# Patient Record
Sex: Female | Born: 1995 | Race: White | Hispanic: No | Marital: Single | Smoking: Former smoker
Health system: Southern US, Community
[De-identification: ages and names within clinical notes are randomized; demographics above are authoritative.]

## PROBLEM LIST (undated history)

## (undated) DIAGNOSIS — J45909 Unspecified asthma, uncomplicated: Secondary | ICD-10-CM

## (undated) HISTORY — PX: ANKLE FRACTURE SURGERY: SHX122

## (undated) HISTORY — PX: CHOLECYSTECTOMY: SHX55

## (undated) HISTORY — PX: NOSE SURGERY: SHX723

---

## 2018-01-30 ENCOUNTER — Encounter (HOSPITAL_BASED_OUTPATIENT_CLINIC_OR_DEPARTMENT_OTHER): Payer: Self-pay | Admitting: Emergency Medicine

## 2018-01-30 ENCOUNTER — Other Ambulatory Visit: Payer: Self-pay

## 2018-01-30 ENCOUNTER — Emergency Department (HOSPITAL_BASED_OUTPATIENT_CLINIC_OR_DEPARTMENT_OTHER)
Admission: EM | Admit: 2018-01-30 | Discharge: 2018-01-30 | Disposition: A | Discharge status: Home / Self Care | Payer: Medicaid Other | Attending: Emergency Medicine | Admitting: Emergency Medicine

## 2018-01-30 DIAGNOSIS — H9203 Otalgia, bilateral: Secondary | ICD-10-CM | POA: Diagnosis present

## 2018-01-30 DIAGNOSIS — J029 Acute pharyngitis, unspecified: Secondary | ICD-10-CM | POA: Diagnosis not present

## 2018-01-30 DIAGNOSIS — F17210 Nicotine dependence, cigarettes, uncomplicated: Secondary | ICD-10-CM | POA: Diagnosis not present

## 2018-01-30 LAB — GROUP A STREP BY PCR: Group A Strep by PCR: NOT DETECTED

## 2018-01-30 NOTE — ED Triage Notes (Signed)
Patient states that she had had ear infection over a week ago  - she reports " it has now gone down to my throat" - patient states that she may have strep throat and her left ear is starting to bother her again. The patient reports that " I know that I have had fevers at night cause a am tossing and turning and sweating all night long" - The patient reports that she has not taken her temp and reports that she is taking aleve for the fever

## 2018-01-30 NOTE — ED Provider Notes (Signed)
[facility] EMERGENCY DEPARTMENT Provider Note   CSN: [id number] Arrival date & time: 01/30/18  1610     History   Chief Complaint Chief Complaint  Patient presents with  . Sore Throat    HPI Tiffany Anthony is a 22 y.o. female presents for evaluation of sore throat x3 days and bilateral ear pain, right greater than left x5 days.  Patient reports that initially she started having ear pain in the right ear.  She states that she used antibiotic eardrops from her friend which provided some improvement.  She reports that 3 days ago, she started having some sore throat.  She states that then she started having pain in bilateral ears.  Patient states she is been taking naproxen, allergy medication, Chloraseptic spray to help with sore throat.  She reports some temporary improvement.  Patient states that she is still able to tolerate her secretions, p.o. without any difficulty.  She states that she has had some subjective fevers but states she has not measured a temperature.  Patient denies any difficulty breathing, chest pain, cough.  The history is provided by the patient.    History reviewed. No pertinent past medical history.  There are no active problems to display for this patient.   Past Surgical History:  Procedure Laterality Date  . NOSE SURGERY       OB History   None      Home Medications    Prior to Admission medications   Not on File    Family History History reviewed. No pertinent family history.  Social History Social History   Tobacco Use  . Smoking status: Current Every Day Smoker  . Smokeless tobacco: Never Used  Substance Use Topics  . Alcohol use: Never    Frequency: Never  . Drug use: Never     Allergies   Patient has no known allergies.   Review of Systems Review of Systems  Constitutional: Negative for fever.  HENT: Positive for ear pain and sore throat. Negative for drooling and trouble swallowing.   Respiratory: Negative for  cough and shortness of breath.   Cardiovascular: Negative for chest pain.  All other systems reviewed and are negative.    Physical Exam Updated Vital Signs BP 136/78 (BP Location: Left Arm)   Pulse 98   Temp 98.3 F (36.8 C) (Oral)   Resp 19   Ht 5\' 6"  (1.676 m)   Wt 93.1 kg (205 lb 4.8 oz)   LMP 01/30/2018   SpO2 100%   BMI 33.14 kg/m   Physical Exam  Constitutional: She appears well-developed and well-nourished.  HENT:  Head: Normocephalic and atraumatic.  Right Ear: Tympanic membrane normal.  Left Ear: Tympanic membrane normal.  Mouth/Throat: Uvula is midline. No trismus in the jaw. Posterior oropharyngeal erythema present.  Right TM is without any effusion, bulging, erythematous.  She does have some slight erythema to the external auditory canal.  No pain with movement of tragus.  Left TM is without any abnormality.  Posterior oropharynx is erythematous.  No exudates, edema.  Uvula is midline.  No trismus.  Airways patent, phonation is intact.  Eyes: Conjunctivae and EOM are normal. Right eye exhibits no discharge. Left eye exhibits no discharge. No scleral icterus.  Pulmonary/Chest: Effort normal.  Neurological: She is alert.  Skin: Skin is warm and dry.  Psychiatric: She has a normal mood and affect. Her speech is normal and behavior is normal.  Nursing note and vitals reviewed.    ED  Treatments / Results  Labs (all labs ordered are listed, but only abnormal results are displayed) Labs Reviewed  GROUP A STREP BY PCR    EKG None  Radiology No results found.  Procedures Procedures (including critical care time)  Medications Ordered in ED Medications - No data to display   Initial Impression / Assessment and Plan / ED Course  I have reviewed the triage vital signs and the nursing notes.  Pertinent labs & imaging results that were available during my care of the patient were reviewed by me and considered in my medical decision making (see chart for  details).     22 year old female who presents for evaluation of sore throat and ear pain.  Reports subjective fevers.  Reports that the ear pain is bilateral but right greater than left.  She has been using her friend's antibiotic eardrops with no improvement. Patient is afebrile, non-toxic appearing, sitting comfortably on examination table. Vital signs reviewed and stable.  On exam, patient has posterior oropharynx erythema.  She does have some erythema noted to the right external auditory canal but no TM erythema, bulging, signs of effusion.  Consider pharyngitis.  History/physical exam is not concerning for acute otitis media, acute otitis externa, mastoiditis./Physical exam is not concerning for Ludwig angina or peritonsillar abscess.  Rapid strep ordered at triage.  Rapid strep reviewed.  Negative.  Discussed results with patient.  Encourage at home supportive care measures. Patient had ample opportunity for questions and discussion. All patient's questions were answered with full understanding. Strict return precautions discussed. Patient expresses understanding and agreement to plan.   Final Clinical Impressions(s) / ED Diagnoses   Final diagnoses:  Sore throat  Otalgia of both ears    ED Discharge Orders    None       Maxwell Caul, PA-C 01/30/18 1750    Charlynne Pander, MD 01/31/18 1504

## 2018-01-30 NOTE — Discharge Instructions (Addendum)
You can take Tylenol or Ibuprofen as directed for pain. You can alternate Tylenol and Ibuprofen every 4 hours. If you take Tylenol at 1pm, then you can take Ibuprofen at 5pm. Then you can take Tylenol again at 9pm.   Make sure you are drinking plenty of fluids and staying hydrated.   Return to the Emergency Department for any worsening pain, fever, difficulty swallowing your saliva, o

## 2018-05-20 ENCOUNTER — Encounter (HOSPITAL_BASED_OUTPATIENT_CLINIC_OR_DEPARTMENT_OTHER): Payer: Self-pay

## 2018-05-20 ENCOUNTER — Emergency Department (HOSPITAL_BASED_OUTPATIENT_CLINIC_OR_DEPARTMENT_OTHER)
Admission: EM | Admit: 2018-05-20 | Discharge: 2018-05-20 | Disposition: A | Discharge status: Home / Self Care | Payer: Medicaid Other | Attending: Emergency Medicine | Admitting: Emergency Medicine

## 2018-05-20 ENCOUNTER — Other Ambulatory Visit: Payer: Self-pay

## 2018-05-20 DIAGNOSIS — G501 Atypical facial pain: Secondary | ICD-10-CM | POA: Insufficient documentation

## 2018-05-20 DIAGNOSIS — F17200 Nicotine dependence, unspecified, uncomplicated: Secondary | ICD-10-CM | POA: Insufficient documentation

## 2018-05-20 DIAGNOSIS — R519 Headache, unspecified: Secondary | ICD-10-CM

## 2018-05-20 DIAGNOSIS — R51 Headache: Secondary | ICD-10-CM

## 2018-05-20 NOTE — ED Notes (Addendum)
Pt seated in ED WR-states she "stepped outside"-pt was called x 3 and was not in ED WR-pt was taken out as LWBS-pt notified of the above and that she would be placed back in computer to be seen-NAD-steady gait

## 2018-05-20 NOTE — ED Provider Notes (Signed)
[facility] EMERGENCY DEPARTMENT Provider Note   CSN: [id number] Arrival date & time: 05/20/18  1248     History   Chief Complaint Chief Complaint  Patient presents with  . Sore    HPI Tiffany Anthony is a 22 y.o. female.m without significant past medical history, presenting to the emergency department with complaint of erythema and pain to the left side of her face that began Wednesday morning.  She states she woke up and noticed a small red swelling to her left cheek that was slightly painful.  She states she thinks she may have been bitten by spider overnight because she cleaned up two spiders from her bedroom earlier in the week.  Denies any tick bites.  Denies difficulty breathing or swelling, fevers, drainage.  States she woke up at night with pain to the left face, redness was increased, and she felt some pain in her lymph nodes below her left ear.  Denies swelling of lips or tongue, denies itching.  Has been taking ibuprofen which significantly improved swelling in symptoms.  The history is provided by the patient.    History reviewed. No pertinent past medical history.  There are no active problems to display for this patient.   Past Surgical History:  Procedure Laterality Date  . NOSE SURGERY       OB History   None      Home Medications    Prior to Admission medications   Not on File    Family History No family history on file.  Social History Social History   Tobacco Use  . Smoking status: Current Every Day Smoker  . Smokeless tobacco: Never Used  Substance Use Topics  . Alcohol use: Not Currently    Frequency: Never  . Drug use: Never     Allergies   Latex   Review of Systems Review of Systems  Constitutional: Negative for fever.  Skin: Positive for color change.     Physical Exam Updated Vital Signs BP 126/78 (BP Location: Right Arm)   Pulse 85   Temp 98.6 F (37 C) (Oral)   Resp 18   Ht 5\' 6"  (1.676 m)   Wt 96.9 kg    LMP 05/11/2018   SpO2 100%   BMI 34.48 kg/m   Physical Exam  Constitutional: She appears well-developed and well-nourished. No distress.  HENT:  Head: Normocephalic and atraumatic.  No involvement of oral mucosa.  No swelling to lips or tongue.  Eyes: Conjunctivae are normal.  Cardiovascular: Normal rate.  Pulmonary/Chest: Effort normal.  Skin:  Very small erythematous papule to the left cheek.  There is no surrounding erythema or induration.  There is no fluctuance.  There is no pustule or vesicle.  There is no petechia.  No lymphadenopathy palpated.  Psychiatric: She has a normal mood and affect. Her behavior is normal.  Nursing note and vitals reviewed.    ED Treatments / Results  Labs (all labs ordered are listed, but only abnormal results are displayed) Labs Reviewed - No data to display  EKG None  Radiology No results found.  Procedures Procedures (including critical care time)  Medications Ordered in ED Medications - No data to display   Initial Impression / Assessment and Plan / ED Course  I have reviewed the triage vital signs and the nursing notes.  Pertinent labs & imaging results that were available during my care of the patient were reviewed by me and considered in my medical decision making (see chart  for details).     Pt with small area of redness to the left cheek, thinks she may have been bitten by spider overnight though did not physically see a bite occur.  No symptoms of anaphylaxis.  No drainage.  No fever.  Treated with ibuprofen with improvement.  On exam there is a small erythematous papule, without pustular vesicle. May become an open comedome, cannot exclude insect bite. No fluctuance to suggest abscess.  There is no surrounding cellulitis or induration.  Do not think antibiotics are indicated at this time.  Discussed symptomatic management including warm compresses and continue ibuprofen.  Follow-up with primary care provider if symptoms  persist. Safe for discharge.   Discussed results, findings, treatment and follow up. Patient advised of return precautions. Patient verbalized understanding and agreed with plan.  Final Clinical Impressions(s) / ED Diagnoses   Final diagnoses:  Left-sided face pain    ED Discharge Orders    None       Monick Rena, [place] N, PA-C 05/20/18 1615    Rolan BuccoBelfi, Melanie, MD 05/20/18 2321

## 2018-05-20 NOTE — Discharge Instructions (Signed)
Apply warm compresses to your cheek, multiple times per day. Continue taking ibuprofen every 6 hours as needed for pain. Follow up with your primary care provider if symptoms persist.

## 2018-05-20 NOTE — ED Triage Notes (Signed)
Pt c/o a sore to left side of face yesterday-feels it may be a spider bite-small red area-NAD-steady gait

## 2018-10-09 ENCOUNTER — Other Ambulatory Visit: Payer: Self-pay

## 2018-10-09 ENCOUNTER — Emergency Department (HOSPITAL_BASED_OUTPATIENT_CLINIC_OR_DEPARTMENT_OTHER)
Admission: EM | Admit: 2018-10-09 | Discharge: 2018-10-09 | Disposition: A | Discharge status: Home / Self Care | Payer: Medicaid Other | Attending: Emergency Medicine | Admitting: Emergency Medicine

## 2018-10-09 ENCOUNTER — Encounter (HOSPITAL_BASED_OUTPATIENT_CLINIC_OR_DEPARTMENT_OTHER): Payer: Self-pay | Admitting: Emergency Medicine

## 2018-10-09 DIAGNOSIS — Z79899 Other long term (current) drug therapy: Secondary | ICD-10-CM | POA: Diagnosis not present

## 2018-10-09 DIAGNOSIS — J3489 Other specified disorders of nose and nasal sinuses: Secondary | ICD-10-CM | POA: Diagnosis not present

## 2018-10-09 DIAGNOSIS — Z91040 Latex allergy status: Secondary | ICD-10-CM | POA: Diagnosis not present

## 2018-10-09 DIAGNOSIS — J45909 Unspecified asthma, uncomplicated: Secondary | ICD-10-CM | POA: Diagnosis not present

## 2018-10-09 DIAGNOSIS — Z760 Encounter for issue of repeat prescription: Secondary | ICD-10-CM | POA: Insufficient documentation

## 2018-10-09 DIAGNOSIS — F17200 Nicotine dependence, unspecified, uncomplicated: Secondary | ICD-10-CM | POA: Diagnosis not present

## 2018-10-09 DIAGNOSIS — R067 Sneezing: Secondary | ICD-10-CM | POA: Diagnosis not present

## 2018-10-09 DIAGNOSIS — H5789 Other specified disorders of eye and adnexa: Secondary | ICD-10-CM | POA: Diagnosis not present

## 2018-10-09 DIAGNOSIS — R0602 Shortness of breath: Secondary | ICD-10-CM | POA: Diagnosis present

## 2018-10-09 MED ORDER — ALBUTEROL SULFATE HFA 108 (90 BASE) MCG/ACT IN AERS: 1.0000 | INHALATION_SPRAY | Freq: Four times a day (QID) | RESPIRATORY_TRACT | 0 refills | Status: AC | PRN

## 2018-10-09 MED ORDER — CETIRIZINE HCL 10 MG PO TABS: 10.0000 mg | ORAL_TABLET | Freq: Every day | ORAL | 0 refills | Status: AC

## 2018-10-09 NOTE — ED Triage Notes (Signed)
Patient states that she has run out of her inhaler about 2 -3 days ago and now she is starting to have some SOB and pain in her lungs when she is walking her dog outside

## 2018-10-09 NOTE — ED Provider Notes (Signed)
[facility] EMERGENCY DEPARTMENT Provider Note   CSN: [id number] Arrival date & time: 10/09/18  1723    History   Chief Complaint Chief Complaint  Patient presents with  . Medication Refill    HPI Tiffany Anthony is a 23 y.o. female with past medical history of asthma, seasonal allergies, presenting to the emergency department with request for albuterol inhaler refill.  Patient states she ran out a few days ago.  She states her seasonal allergies have been acting up, however she is not nearly out of her Zyrtec as well.  She feels short of breath with chest tightness when walking her dog.  No productive cough, fever, or symptoms of illness.  She does endorse sneezing, rhinorrhea and itchy eyes, typical for her seasonal allergies.  She is not currently short of breath in the ED.  She states she takes a daily inhaler, which she thinks may be Atrovent, as well as rescue inhaler and Zyrtec.     The history is provided by the patient.    History reviewed. No pertinent past medical history.  There are no active problems to display for this patient.   Past Surgical History:  Procedure Laterality Date  . NOSE SURGERY       OB History   No obstetric history on file.      Home Medications    Prior to Admission medications   Medication Sig Start Date End Date Taking? Authorizing Provider  etonogestrel (NEXPLANON) 68 MG IMPL implant Nexplanon 68 mg subdermal implant  Inject by subcutaneous route. 12/22/14  Yes [provider]  albuterol (PROVENTIL HFA;VENTOLIN HFA) 108 (90 Base) MCG/ACT inhaler Inhale 1-2 puffs into the lungs every 6 (six) hours as needed for wheezing or shortness of breath. 10/09/18   Jady Braggs, [place] N, PA-C  cetirizine (ZYRTEC) 10 MG tablet Take 1 tablet (10 mg total) by mouth daily. 10/09/18   Hayward Rylander, [place] N, PA-C    Family History History reviewed. No pertinent family history.  Social History Social History   Tobacco Use  . Smoking  status: Current Every Day Smoker  . Smokeless tobacco: Never Used  Substance Use Topics  . Alcohol use: Not Currently    Frequency: Never  . Drug use: Never     Allergies   Latex   Review of Systems Review of Systems  Constitutional: Negative for fever.  HENT: Positive for rhinorrhea and sneezing. Negative for congestion.   Eyes: Positive for itching.  Respiratory: Positive for shortness of breath (not active).      Physical Exam Updated Vital Signs BP 111/60 (BP Location: Left Arm)   Pulse 85   Temp 98.5 F (36.9 C) (Oral)   Resp 20   Ht 5\' 6"  (1.676 m)   Wt 90.7 kg   LMP 09/12/2018   SpO2 100%   BMI 32.28 kg/m   Physical Exam Vitals signs and nursing note reviewed.  Constitutional:      General: She is not in acute distress.    Appearance: She is well-developed.  HENT:     Head: Normocephalic and atraumatic.     Mouth/Throat:     Mouth: Mucous membranes are moist.     Pharynx: Oropharynx is clear.  Eyes:     Conjunctiva/sclera: Conjunctivae normal.  Neck:     Musculoskeletal: Normal range of motion and neck supple.  Cardiovascular:     Rate and Rhythm: Normal rate and regular rhythm.  Pulmonary:     Effort: Pulmonary effort is normal. No  respiratory distress.     Breath sounds: Normal breath sounds.  Lymphadenopathy:     Cervical: No cervical adenopathy.  Neurological:     Mental Status: She is alert.  Psychiatric:        Mood and Affect: Mood normal.        Behavior: Behavior normal.      ED Treatments / Results  Labs (all labs ordered are listed, but only abnormal results are displayed) Labs Reviewed - No data to display  EKG None  Radiology No results found.  Procedures Procedures (including critical care time)  Medications Ordered in ED Medications - No data to display   Initial Impression / Assessment and Plan / ED Course  I have reviewed the triage vital signs and the nursing notes.  Pertinent labs & imaging results that  were available during my care of the patient were reviewed by me and considered in my medical decision making (see chart for details).        Patient presenting requesting refill for albuterol inhaler and Zyrtec.  She endorses symptoms of seasonal allergies as well.  She is not currently short of breath in the ED. States she ran out of her medications a couple of days ago.  She states she went to urgent care prior to this, however they sent her here because she was unable to afford urgent care visit.  Lung exam is clear.  Vital signs are normal.  O2 saturation 100% on room air.  Not consistent with asthma exacerbation.  Will prescribe refill for albuterol inhaler and Zyrtec.  Patient states she takes a third inhaler daily, not as needed, and thinks this may be Atrovent.  Discussed with patient that Atrovent is not prescribed this way and recommend she follow-up with her primary care for refill of her daily inhaler, as it is likely a different medication.  Well-appearing and safe for discharge.  Discussed results, findings, treatment and follow up. Patient advised of return precautions. Patient verbalized understanding and agreed with plan.   Final Clinical Impressions(s) / ED Diagnoses   Final diagnoses:  Encounter for medication refill  Mild asthma without complication, unspecified whether persistent    ED Discharge Orders         Ordered    albuterol (PROVENTIL HFA;VENTOLIN HFA) 108 (90 Base) MCG/ACT inhaler  Every 6 hours PRN     10/09/18 1814    cetirizine (ZYRTEC) 10 MG tablet  Daily     10/09/18 1814           Almee Pelphrey, [place] N, PA-C 10/09/18 Carolynn [id number]    Campos, Kevin, MD 10/09/18 2038

## 2021-05-08 ENCOUNTER — Emergency Department (HOSPITAL_BASED_OUTPATIENT_CLINIC_OR_DEPARTMENT_OTHER)
Admission: EM | Admit: 2021-05-08 | Discharge: 2021-05-08 | Disposition: A | Discharge status: Home / Self Care | Payer: Medicaid Other | Attending: Emergency Medicine | Admitting: Emergency Medicine

## 2021-05-08 ENCOUNTER — Encounter (HOSPITAL_BASED_OUTPATIENT_CLINIC_OR_DEPARTMENT_OTHER): Payer: Self-pay | Admitting: Emergency Medicine

## 2021-05-08 ENCOUNTER — Other Ambulatory Visit: Payer: Self-pay

## 2021-05-08 DIAGNOSIS — Z20822 Contact with and (suspected) exposure to covid-19: Secondary | ICD-10-CM | POA: Diagnosis not present

## 2021-05-08 DIAGNOSIS — J101 Influenza due to other identified influenza virus with other respiratory manifestations: Secondary | ICD-10-CM | POA: Insufficient documentation

## 2021-05-08 DIAGNOSIS — G43009 Migraine without aura, not intractable, without status migrainosus: Secondary | ICD-10-CM

## 2021-05-08 DIAGNOSIS — J45909 Unspecified asthma, uncomplicated: Secondary | ICD-10-CM | POA: Diagnosis not present

## 2021-05-08 DIAGNOSIS — G43909 Migraine, unspecified, not intractable, without status migrainosus: Secondary | ICD-10-CM | POA: Diagnosis not present

## 2021-05-08 DIAGNOSIS — Z91040 Latex allergy status: Secondary | ICD-10-CM | POA: Insufficient documentation

## 2021-05-08 DIAGNOSIS — Z87891 Personal history of nicotine dependence: Secondary | ICD-10-CM | POA: Diagnosis not present

## 2021-05-08 DIAGNOSIS — R519 Headache, unspecified: Secondary | ICD-10-CM | POA: Diagnosis present

## 2021-05-08 HISTORY — DX: Unspecified asthma, uncomplicated: J45.909

## 2021-05-08 LAB — RESP PANEL BY RT-PCR (FLU A&B, COVID) ARPGX2
Influenza A by PCR: POSITIVE — AB
Influenza B by PCR: NEGATIVE
SARS Coronavirus 2 by RT PCR: NEGATIVE

## 2021-05-08 LAB — GROUP A STREP BY PCR: Group A Strep by PCR: NOT DETECTED

## 2021-05-08 MED ORDER — DEXAMETHASONE SODIUM PHOSPHATE 10 MG/ML IJ SOLN
10.0000 mg | Freq: Once | INTRAMUSCULAR | Status: AC
  Administered 2021-05-08: 10 mg via INTRAVENOUS
  Filled 2021-05-08: qty 1

## 2021-05-08 MED ORDER — LACTATED RINGERS IV BOLUS
1000.0000 mL | Freq: Once | INTRAVENOUS | Status: AC
  Administered 2021-05-08: 1000 mL via INTRAVENOUS

## 2021-05-08 MED ORDER — PROCHLORPERAZINE EDISYLATE 10 MG/2ML IJ SOLN
10.0000 mg | Freq: Once | INTRAMUSCULAR | Status: AC
  Administered 2021-05-08: 10 mg via INTRAVENOUS
  Filled 2021-05-08: qty 2

## 2021-05-08 MED ORDER — ACETAMINOPHEN 325 MG PO TABS
650.0000 mg | ORAL_TABLET | Freq: Once | ORAL | Status: AC
  Administered 2021-05-08: 650 mg via ORAL
  Filled 2021-05-08: qty 2

## 2021-05-08 MED ORDER — KETOROLAC TROMETHAMINE 30 MG/ML IJ SOLN
30.0000 mg | Freq: Once | INTRAMUSCULAR | Status: AC
Start: 2021-05-08 — End: 2021-05-08
  Administered 2021-05-08: 30 mg via INTRAVENOUS
  Filled 2021-05-08: qty 1

## 2021-05-08 NOTE — ED Triage Notes (Signed)
Pt is c/o headache x 2 days  Pt also c/o fever, chills, states her eyes hurt, and nausea

## 2021-05-08 NOTE — ED Provider Notes (Signed)
[facility] EMERGENCY DEPARTMENT Provider Note   CSN: [id number] Arrival date & time: 05/08/21  0244     History Chief Complaint  Patient presents with   Headache    Tiffany Anthony is a 25 y.o. female.  The history is provided by the patient.  Headache She has history of asthma, migraines and comes in complaining of a migraine headache which started yesterday morning.  Headache is bifrontal and also involves the face, and is typical of her migraines.  She describes a throbbing pain which she rates at 9/10.  There is associated photophobia and phonophobia.  She complains of nausea and vomiting.  There has also been a sore throat and she has noted fever and chills.  She has tried taking Excedrin migraine and ibuprofen without any relief.  She denies any cough and denies arthralgias or myalgias.  She did take a home COVID test this morning which was negative.   Past Medical History:  Diagnosis Date   Asthma     There are no problems to display for this patient.   Past Surgical History:  Procedure Laterality Date   NOSE SURGERY       OB History   No obstetric history on file.     Family History  Problem Relation Age of Onset   Migraines Mother    Hypertension Mother    Diabetes Mother     Social History   Tobacco Use   Smoking status: Former    Types: Cigarettes   Smokeless tobacco: Never  Vaping Use   Vaping Use: Some days   Substances: Nicotine  Substance Use Topics   Alcohol use: Never   Drug use: Never    Home Medications Prior to Admission medications   Medication Sig Start Date End Date Taking? Authorizing Provider  albuterol (PROVENTIL HFA;VENTOLIN HFA) 108 (90 Base) MCG/ACT inhaler Inhale 1-2 puffs into the lungs every 6 (six) hours as needed for wheezing or shortness of breath. 10/09/18   Robinson, [place] N, PA-C  cetirizine (ZYRTEC) 10 MG tablet Take 1 tablet (10 mg total) by mouth daily. 10/09/18   Robinson, [place] N, PA-C  etonogestrel  (NEXPLANON) 68 MG IMPL implant Nexplanon 68 mg subdermal implant  Inject by subcutaneous route. 12/22/14   [provider]    Allergies    Latex  Review of Systems   Review of Systems  Neurological:  Positive for headaches.  All other systems reviewed and are negative.  Physical Exam Updated Vital Signs BP 120/61 (BP Location: Right Arm)   Pulse (!) 134   Temp (!) 100.8 F (38.2 C) (Oral)   Resp 20   Ht 5\' 6"  (1.676 m)   Wt 107.7 kg   SpO2 98%   BMI 38.33 kg/m   Physical Exam Vitals and nursing note reviewed.  25 year old female, resting comfortably and in no acute distress. Vital signs are significant for elevated heart rate and temperature. Oxygen saturation is 98%, which is normal. Head is normocephalic and atraumatic. PERRLA, EOMI. Oropharynx is clear.  There is tenderness over the temporalis muscles bilaterally as well as over the frontal and maxillary sinuses. Neck is nontender and supple without adenopathy or JVD. Back is nontender and there is no CVA tenderness. Lungs are clear without rales, wheezes, or rhonchi. Chest is nontender. Heart has regular rate and rhythm without murmur. Abdomen is soft, flat, nontender without masses or hepatosplenomegaly and peristalsis is normoactive. Extremities have no cyanosis or edema, full range of motion is  present. Skin is warm and dry without rash. Neurologic: Mental status is normal, cranial nerves are intact, moves all extremities equally.  ED Results / Procedures / Treatments   Labs (all labs ordered are listed, but only abnormal results are displayed) Labs Reviewed  RESP PANEL BY RT-PCR (FLU A&B, COVID) [id number]  GROUP A STREP BY PCR    EKG None  Radiology No results found.  Procedures Procedures   Medications Ordered in ED Medications  lactated ringers bolus 1,000 mL (0 mLs Intravenous Stopped 05/08/21 0458)  prochlorperazine (COMPAZINE) injection 10 mg (10 mg Intravenous Given 05/08/21 0325)   dexamethasone (DECADRON) injection 10 mg (10 mg Intravenous Given 05/08/21 0328)  ketorolac (TORADOL) 30 MG/ML injection 30 mg (30 mg Intravenous Given 05/08/21 0323)  acetaminophen (TYLENOL) tablet 650 mg (650 mg Oral Given 05/08/21 0330)  lactated ringers bolus 1,000 mL (1,000 mLs Intravenous New Bag/Given 05/08/21 0458)    ED Course  I have reviewed the triage vital signs and the nursing notes.  Pertinent labs & imaging results that were available during my care of the patient were reviewed by me and considered in my medical decision making (see chart for details).    MDM Rules/Calculators/A&P                         Fever and headache.  Presentation is suggestive of viral illness such as influenza, also consider streptococcal infection.  She will be given IV fluids, prochlorperazine, dexamethasone, ketorolac.  Will check respiratory pathogen panel and strep PCR.  Old records are reviewed, and she has no relevant past visits.  Swabs are positive for influenza A.  Headache is markedly improved following above-noted treatment, but blood pressure is slightly low.  She will be given additional IV fluids.  Blood pressure has come up with the second liter of IV fluids, and she is discharged.  Final Clinical Impression(s) / ED Diagnoses Final diagnoses:  Migraine without aura and without status migrainosus, not intractable  Influenza A    Rx / DC Orders ED Discharge Orders     None        Dione Booze, MD 05/08/21 [phone]

## 2021-08-16 ENCOUNTER — Other Ambulatory Visit: Payer: Self-pay

## 2021-08-16 ENCOUNTER — Encounter (HOSPITAL_BASED_OUTPATIENT_CLINIC_OR_DEPARTMENT_OTHER): Payer: Self-pay | Admitting: Emergency Medicine

## 2021-08-16 ENCOUNTER — Emergency Department (HOSPITAL_BASED_OUTPATIENT_CLINIC_OR_DEPARTMENT_OTHER): Payer: Medicaid Other

## 2021-08-16 ENCOUNTER — Emergency Department (HOSPITAL_BASED_OUTPATIENT_CLINIC_OR_DEPARTMENT_OTHER)
Admission: EM | Admit: 2021-08-16 | Discharge: 2021-08-16 | Disposition: A | Discharge status: Home / Self Care | Payer: Medicaid Other | Attending: Emergency Medicine | Admitting: Emergency Medicine

## 2021-08-16 DIAGNOSIS — R1032 Left lower quadrant pain: Secondary | ICD-10-CM | POA: Diagnosis present

## 2021-08-16 DIAGNOSIS — Z91040 Latex allergy status: Secondary | ICD-10-CM | POA: Diagnosis not present

## 2021-08-16 DIAGNOSIS — N390 Urinary tract infection, site not specified: Secondary | ICD-10-CM | POA: Insufficient documentation

## 2021-08-16 LAB — PREGNANCY, URINE: Preg Test, Ur: NEGATIVE

## 2021-08-16 LAB — URINALYSIS, ROUTINE W REFLEX MICROSCOPIC
Bilirubin Urine: NEGATIVE
Glucose, UA: NEGATIVE mg/dL
Ketones, ur: NEGATIVE mg/dL
Leukocytes,Ua: NEGATIVE
Nitrite: NEGATIVE
Protein, ur: NEGATIVE mg/dL
Specific Gravity, Urine: 1.025 (ref 1.005–1.030)
pH: 6.5 (ref 5.0–8.0)

## 2021-08-16 LAB — COMPREHENSIVE METABOLIC PANEL
ALT: 18 U/L (ref 0–44)
AST: 20 U/L (ref 15–41)
Albumin: 4.4 g/dL (ref 3.5–5.0)
Alkaline Phosphatase: 65 U/L (ref 38–126)
Anion gap: 9 (ref 5–15)
BUN: 13 mg/dL (ref 6–20)
CO2: 25 mmol/L (ref 22–32)
Calcium: 9.4 mg/dL (ref 8.9–10.3)
Chloride: 102 mmol/L (ref 98–111)
Creatinine, Ser: 0.68 mg/dL (ref 0.44–1.00)
GFR, Estimated: >60 mL/min (ref >60)
Glucose, Bld: 98 mg/dL (ref 70–99)
Potassium: 3.9 mmol/L (ref 3.5–5.1)
Sodium: 136 mmol/L (ref 135–145)
Total Bilirubin: 0.6 mg/dL (ref 0.3–1.2)
Total Protein: 8.2 g/dL — ABNORMAL HIGH (ref 6.5–8.1)

## 2021-08-16 LAB — CBC WITH DIFFERENTIAL/PLATELET
Abs Immature Granulocytes: 0.04 10*3/uL (ref 0.00–0.07)
Basophils Absolute: 0.1 10*3/uL (ref 0.0–0.1)
Basophils Relative: 1 %
Eosinophils Absolute: 0.3 10*3/uL (ref 0.0–0.5)
Eosinophils Relative: 3 %
HCT: 39.6 % (ref 36.0–46.0)
Hemoglobin: 13.7 g/dL (ref 12.0–15.0)
Immature Granulocytes: 0 %
Lymphocytes Relative: 27 %
Lymphs Abs: 2.6 10*3/uL (ref 0.7–4.0)
MCH: 29.8 pg (ref 26.0–34.0)
MCHC: 34.6 g/dL (ref 30.0–36.0)
MCV: 86.3 fL (ref 80.0–100.0)
Monocytes Absolute: 0.6 10*3/uL (ref 0.1–1.0)
Monocytes Relative: 7 %
Neutro Abs: 6 10*3/uL (ref 1.7–7.7)
Neutrophils Relative %: 62 %
Platelets: 338 10*3/uL (ref 150–400)
RBC: 4.59 MIL/uL (ref 3.87–5.11)
RDW: 12.2 % (ref 11.5–15.5)
WBC: 9.7 10*3/uL (ref 4.0–10.5)
nRBC: 0 % (ref 0.0–0.2)

## 2021-08-16 LAB — URINALYSIS, MICROSCOPIC (REFLEX)

## 2021-08-16 MED ORDER — NITROFURANTOIN MONOHYD MACRO 100 MG PO CAPS
100.0000 mg | ORAL_CAPSULE | Freq: Once | ORAL | Status: AC
  Administered 2021-08-16: 100 mg via ORAL
  Filled 2021-08-16: qty 1

## 2021-08-16 MED ORDER — KETOROLAC TROMETHAMINE 30 MG/ML IJ SOLN
30.0000 mg | Freq: Once | INTRAMUSCULAR | Status: AC
  Administered 2021-08-16: 30 mg via INTRAVENOUS
  Filled 2021-08-16: qty 1

## 2021-08-16 MED ORDER — NITROFURANTOIN MONOHYD MACRO 100 MG PO CAPS: 100.0000 mg | ORAL_CAPSULE | Freq: Two times a day (BID) | ORAL | 0 refills | Status: DC

## 2021-08-16 NOTE — ED Triage Notes (Signed)
Pt c/o LLQ abd pain

## 2021-08-16 NOTE — ED Provider Notes (Addendum)
[facility] EMERGENCY DEPARTMENT Provider Note   CSN: [id number] Arrival date & time: 08/16/21  0400     History  Chief Complaint  Patient presents with   Abdominal Pain    Tiffany Anthony is a 26 y.o. female.  The history is provided by the patient.  Abdominal Pain Pain location:  LUQ and LLQ Pain quality: bloating   Pain radiates to:  Does not radiate Pain severity:  Moderate Onset quality:  Sudden Duration: hours. Timing:  Constant Progression:  Unchanged Chronicity:  New Context: not diet changes, not eating, not suspicious food intake and not trauma   Relieved by:  Nothing Worsened by:  Nothing Ineffective treatments:  None tried Associated symptoms: no anorexia, no constipation, no diarrhea, no fever, no hematemesis, no nausea and no vomiting   Risk factors: no alcohol abuse   Patient with no significant PMH who presents with LUQ/LLQ pain that feels bloated and swollen.  No f/c/r.  No n/v/d.      Home Medications Prior to Admission medications   Medication Sig Start Date End Date Taking? Authorizing Provider  nitrofurantoin, macrocrystal-monohydrate, (MACROBID) 100 MG capsule Take 1 capsule (100 mg total) by mouth 2 (two) times daily. X 7 days 08/16/21  Yes Marylynne Keelin, MD  albuterol (PROVENTIL HFA;VENTOLIN HFA) 108 (90 Base) MCG/ACT inhaler Inhale 1-2 puffs into the lungs every 6 (six) hours as needed for wheezing or shortness of breath. 10/09/18   Robinson, [place] N, PA-C  cetirizine (ZYRTEC) 10 MG tablet Take 1 tablet (10 mg total) by mouth daily. 10/09/18   Robinson, [place] N, PA-C  etonogestrel (NEXPLANON) 68 MG IMPL implant Nexplanon 68 mg subdermal implant  Inject by subcutaneous route. 12/22/14   [provider]      Allergies    Latex    Review of Systems   Review of Systems  Constitutional:  Negative for fever.  HENT:  Negative for facial swelling.   Eyes:  Negative for redness.  Respiratory:  Negative for wheezing and stridor.    Gastrointestinal:  Positive for abdominal pain. Negative for anorexia, constipation, diarrhea, hematemesis, nausea and vomiting.  Genitourinary:  Negative for flank pain.  Neurological:  Negative for facial asymmetry.  Psychiatric/Behavioral:  Negative for agitation.    Physical Exam Updated Vital Signs BP 106/70 (BP Location: Left Arm)    Pulse 83    Temp 98.1 F (36.7 C) (Oral)    Resp 16    Ht 5\' 6"  (1.676 m)    Wt 108.9 kg    LMP 08/02/2021 (Approximate)    SpO2 100%    BMI 38.74 kg/m  Physical Exam Vitals and nursing note reviewed.  Constitutional:      General: She is not in acute distress.    Appearance: Normal appearance.  HENT:     Head: Normocephalic and atraumatic.     Nose: Nose normal.  Eyes:     Conjunctiva/sclera: Conjunctivae normal.     Pupils: Pupils are equal, round, and reactive to light.  Cardiovascular:     Rate and Rhythm: Normal rate and regular rhythm.     Pulses: Normal pulses.     Heart sounds: Normal heart sounds.  Pulmonary:     Effort: Pulmonary effort is normal.     Breath sounds: Normal breath sounds.  Abdominal:     General: Bowel sounds are normal.     Palpations: Abdomen is soft.     Tenderness: There is no abdominal tenderness. There is no guarding or rebound.  Hernia: No hernia is present.  Musculoskeletal:        General: Normal range of motion.     Cervical back: Normal range of motion and neck supple.  Skin:    General: Skin is warm and dry.     Capillary Refill: Capillary refill takes less than 2 seconds.  Neurological:     General: No focal deficit present.     Mental Status: She is alert and oriented to person, place, and time.     Deep Tendon Reflexes: Reflexes normal.  Psychiatric:        Mood and Affect: Mood normal.        Behavior: Behavior normal.    ED Results / Procedures / Treatments   Labs (all labs ordered are listed, but only abnormal results are displayed) Results for orders placed or performed during the  hospital encounter of 08/16/21  Urinalysis, Routine w reflex microscopic Urine, Clean Catch  Result Value Ref Range   Color, Urine YELLOW YELLOW   APPearance CLEAR CLEAR   Specific Gravity, Urine 1.025 1.005 - 1.030   pH 6.5 5.0 - 8.0   Glucose, UA NEGATIVE NEGATIVE mg/dL   Hgb urine dipstick MODERATE (A) NEGATIVE   Bilirubin Urine NEGATIVE NEGATIVE   Ketones, ur NEGATIVE NEGATIVE mg/dL   Protein, ur NEGATIVE NEGATIVE mg/dL   Nitrite NEGATIVE NEGATIVE   Leukocytes,Ua NEGATIVE NEGATIVE  Pregnancy, urine  Result Value Ref Range   Preg Test, Ur NEGATIVE NEGATIVE  CBC with Differential  Result Value Ref Range   WBC 9.7 4.0 - 10.5 K/uL   RBC 4.59 3.87 - 5.11 MIL/uL   Hemoglobin 13.7 12.0 - 15.0 g/dL   HCT 61.4 43.1 - 54.0 %   MCV 86.3 80.0 - 100.0 fL   MCH 29.8 26.0 - 34.0 pg   MCHC 34.6 30.0 - 36.0 g/dL   RDW 08.6 76.1 - 95.0 %   Platelets 338 150 - 400 K/uL   nRBC 0.0 0.0 - 0.2 %   Neutrophils Relative % 62 %   Neutro Abs 6.0 1.7 - 7.7 K/uL   Lymphocytes Relative 27 %   Lymphs Abs 2.6 0.7 - 4.0 K/uL   Monocytes Relative 7 %   Monocytes Absolute 0.6 0.1 - 1.0 K/uL   Eosinophils Relative 3 %   Eosinophils Absolute 0.3 0.0 - 0.5 K/uL   Basophils Relative 1 %   Basophils Absolute 0.1 0.0 - 0.1 K/uL   Immature Granulocytes 0 %   Abs Immature Granulocytes 0.04 0.00 - 0.07 K/uL  Comprehensive metabolic panel  Result Value Ref Range   Sodium 136 135 - 145 mmol/L   Potassium 3.9 3.5 - 5.1 mmol/L   Chloride 102 98 - 111 mmol/L   CO2 25 22 - 32 mmol/L   Glucose, Bld 98 70 - 99 mg/dL   BUN 13 6 - 20 mg/dL   Creatinine, Ser 9.32 0.44 - 1.00 mg/dL   Calcium 9.4 8.9 - 67.1 mg/dL   Total Protein 8.2 (H) 6.5 - 8.1 g/dL   Albumin 4.4 3.5 - 5.0 g/dL   AST 20 15 - 41 U/L   ALT 18 0 - 44 U/L   Alkaline Phosphatase 65 38 - 126 U/L   Total Bilirubin 0.6 0.3 - 1.2 mg/dL   GFR, Estimated >24 >58 mL/min   Anion gap 9 5 - 15  Urinalysis, Microscopic (reflex)  Result Value Ref Range    RBC / HPF 6-10 0 - 5 RBC/hpf   WBC, UA  0-5 0 - 5 WBC/hpf   Bacteria, UA MANY (A) NONE SEEN   Squamous Epithelial / LPF 0-5 0 - 5   CT Renal Stone Study  Result Date: 08/16/2021 CLINICAL DATA:  Flank pain.  Evaluate for kidney stone. EXAM: CT ABDOMEN AND PELVIS WITHOUT CONTRAST TECHNIQUE: Multidetector CT imaging of the abdomen and pelvis was performed following the standard protocol without IV contrast. RADIATION DOSE REDUCTION: This exam was performed according to the departmental dose-optimization program which includes automated exposure control, adjustment of the mA and/or kV according to patient size and/or use of iterative reconstruction technique. COMPARISON:  None. FINDINGS: Lower chest: Lung bases are clear. Hepatobiliary: No focal liver abnormality. Gallbladder appears normal. No bile duct dilatation. Pancreas: Unremarkable. No pancreatic ductal dilatation or surrounding inflammatory changes. Spleen: Normal in size without focal abnormality. Adrenals/Urinary Tract: Normal adrenal glands. Bilateral renal calculi are identified. The largest right renal calculi is in the upper pole measuring 2-3 mm, image 29/7. On the left the largest stone is in the upper pole measuring 3 mm. There is no hydronephrosis or mass bilaterally. No convincing evidence for hydroureter or ureteral lithiasis bilaterally. No bladder calculi noted. Urinary bladder appears unremarkable. Stomach/Bowel: Stomach is within normal limits. The appendix is visualized and is within normal limits, image 52/7. No bowel wall thickening, inflammation, or distension. Vascular/Lymphatic: No significant vascular findings are present. No enlarged abdominal or pelvic lymph nodes. Reproductive: Uterus appears normal. No adnexal mass. Fluid density cyst within the right ovary measures 2.3 cm. Other: No abdominal wall hernia or abnormality. No abdominopelvic ascites. Musculoskeletal: No acute or significant osseous findings. IMPRESSION: 1. No acute  findings within the abdomen or pelvis. 2. Bilateral nephrolithiasis. 3. 2.3 cm right ovarian simple-appearing cyst. No follow-up imaging is recommended. Electronically Signed   By: Signa Kellaylor  Stroud M.D.   On: 08/16/2021 05:51     Radiology CT Renal Stone Study  Result Date: 08/16/2021 CLINICAL DATA:  Flank pain.  Evaluate for kidney stone. EXAM: CT ABDOMEN AND PELVIS WITHOUT CONTRAST TECHNIQUE: Multidetector CT imaging of the abdomen and pelvis was performed following the standard protocol without IV contrast. RADIATION DOSE REDUCTION: This exam was performed according to the departmental dose-optimization program which includes automated exposure control, adjustment of the mA and/or kV according to patient size and/or use of iterative reconstruction technique. COMPARISON:  None. FINDINGS: Lower chest: Lung bases are clear. Hepatobiliary: No focal liver abnormality. Gallbladder appears normal. No bile duct dilatation. Pancreas: Unremarkable. No pancreatic ductal dilatation or surrounding inflammatory changes. Spleen: Normal in size without focal abnormality. Adrenals/Urinary Tract: Normal adrenal glands. Bilateral renal calculi are identified. The largest right renal calculi is in the upper pole measuring 2-3 mm, image 29/7. On the left the largest stone is in the upper pole measuring 3 mm. There is no hydronephrosis or mass bilaterally. No convincing evidence for hydroureter or ureteral lithiasis bilaterally. No bladder calculi noted. Urinary bladder appears unremarkable. Stomach/Bowel: Stomach is within normal limits. The appendix is visualized and is within normal limits, image 52/7. No bowel wall thickening, inflammation, or distension. Vascular/Lymphatic: No significant vascular findings are present. No enlarged abdominal or pelvic lymph nodes. Reproductive: Uterus appears normal. No adnexal mass. Fluid density cyst within the right ovary measures 2.3 cm. Other: No abdominal wall hernia or abnormality. No  abdominopelvic ascites. Musculoskeletal: No acute or significant osseous findings. IMPRESSION: 1. No acute findings within the abdomen or pelvis. 2. Bilateral nephrolithiasis. 3. 2.3 cm right ovarian simple-appearing cyst. No follow-up imaging is recommended. Electronically Signed  By: Signa Kell M.D.   On: 08/16/2021 05:51    Procedures Procedures    Medications Ordered in ED Medications  ketorolac (TORADOL) 30 MG/ML injection 30 mg (30 mg Intravenous Given 08/16/21 0531)  nitrofurantoin (macrocrystal-monohydrate) (MACROBID) capsule 100 mg (100 mg Oral Given 08/16/21 6378)    ED Course/ Medical Decision Making/ A&P                           Medical Decision Making Patient with several hours of LUQ/LLQ pain.  No associated symptoms.  She currently has her menstrual cycle.    Amount and/or Complexity of Data Reviewed External Data Reviewed: notes.    Details: Notes from [facility] for 03/2021 reviewed Labs: ordered.    Details: personally reviewed all labs: normal CBC, white count is not elevated.  Normal electrolytes and kidney function.  Urinalysis is consistent with infection Radiology: ordered.    Details: CT renal stone reviewed by me and negative for acute finding on the left side of the body  Risk Prescription drug management. Risk Details: Patient is well appearing with normal vital signs, exam, and imaging.  I cannot exam the feeling of swelling but the patient has a UTI on urine and I have started treatment for this condition.  Patient is well appearing and stable for discharge with close follow up.      Final Clinical Impression(s) / ED Diagnoses Final diagnoses:  Lower urinary tract infectious disease   Return for intractable cough, coughing up blood, fevers > 100.4 unrelieved by medication, shortness of breath, intractable vomiting, chest pain, shortness of breath, weakness, numbness, changes in speech, facial asymmetry, abdominal pain, passing out, Inability to  tolerate liquids or food, cough, altered mental status or any concerns. No signs of systemic illness or infection. The patient is nontoxic-appearing on exam and vital signs are within normal limits.  I have reviewed the triage vital signs and the nursing notes. Pertinent labs & imaging results that were available during my care of the patient were reviewed by me and considered in my medical decision making (see chart for details). After history, exam, and medical workup I feel the patient has been appropriately medically screened and is safe for discharge home. Pertinent diagnoses were discussed with the patient. Patient was given return precautions.  Rx / DC Orders ED Discharge Orders          Ordered    nitrofurantoin, macrocrystal-monohydrate, (MACROBID) 100 MG capsule  2 times daily        08/16/21 0607              Devontay Celaya, MD 08/16/21 5885    Nicanor Alcon, Clarise Chacko, MD 08/16/21 0277

## 2022-09-18 ENCOUNTER — Emergency Department (HOSPITAL_BASED_OUTPATIENT_CLINIC_OR_DEPARTMENT_OTHER)
Admission: EM | Admit: 2022-09-18 | Discharge: 2022-09-18 | Disposition: A | Discharge status: Home / Self Care | Payer: Medicaid Other | Attending: Emergency Medicine | Admitting: Emergency Medicine

## 2022-09-18 ENCOUNTER — Other Ambulatory Visit: Payer: Self-pay

## 2022-09-18 ENCOUNTER — Emergency Department (HOSPITAL_BASED_OUTPATIENT_CLINIC_OR_DEPARTMENT_OTHER): Payer: Medicaid Other

## 2022-09-18 ENCOUNTER — Encounter (HOSPITAL_BASED_OUTPATIENT_CLINIC_OR_DEPARTMENT_OTHER): Payer: Self-pay | Admitting: Pediatrics

## 2022-09-18 DIAGNOSIS — Z7951 Long term (current) use of inhaled steroids: Secondary | ICD-10-CM | POA: Insufficient documentation

## 2022-09-18 DIAGNOSIS — S62345A Nondisplaced fracture of base of fourth metacarpal bone, left hand, initial encounter for closed fracture: Secondary | ICD-10-CM | POA: Insufficient documentation

## 2022-09-18 DIAGNOSIS — Z91040 Latex allergy status: Secondary | ICD-10-CM | POA: Insufficient documentation

## 2022-09-18 DIAGNOSIS — Y92410 Unspecified street and highway as the place of occurrence of the external cause: Secondary | ICD-10-CM | POA: Insufficient documentation

## 2022-09-18 DIAGNOSIS — J45909 Unspecified asthma, uncomplicated: Secondary | ICD-10-CM | POA: Diagnosis not present

## 2022-09-18 DIAGNOSIS — S6992XA Unspecified injury of left wrist, hand and finger(s), initial encounter: Secondary | ICD-10-CM | POA: Diagnosis present
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
## 2022-09-18 NOTE — ED Notes (Signed)
ED Provider at bedside. 
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
## 2022-09-19 ENCOUNTER — Encounter (HOSPITAL_BASED_OUTPATIENT_CLINIC_OR_DEPARTMENT_OTHER): Payer: Self-pay | Admitting: Emergency Medicine

## 2022-09-19 ENCOUNTER — Other Ambulatory Visit: Payer: Self-pay

## 2022-09-19 ENCOUNTER — Emergency Department (HOSPITAL_BASED_OUTPATIENT_CLINIC_OR_DEPARTMENT_OTHER): Payer: Medicaid Other

## 2022-09-19 ENCOUNTER — Emergency Department (HOSPITAL_BASED_OUTPATIENT_CLINIC_OR_DEPARTMENT_OTHER)
Admission: EM | Admit: 2022-09-19 | Discharge: 2022-09-19 | Disposition: A | Discharge status: Home / Self Care | Payer: Medicaid Other | Attending: Emergency Medicine | Admitting: Emergency Medicine

## 2022-09-19 DIAGNOSIS — Z91040 Latex allergy status: Secondary | ICD-10-CM | POA: Insufficient documentation

## 2022-09-19 DIAGNOSIS — S0990XA Unspecified injury of head, initial encounter: Secondary | ICD-10-CM | POA: Diagnosis present

## 2022-09-19 DIAGNOSIS — S060X0A Concussion without loss of consciousness, initial encounter: Secondary | ICD-10-CM | POA: Insufficient documentation

## 2022-09-19 DIAGNOSIS — S8002XA Contusion of left knee, initial encounter: Secondary | ICD-10-CM | POA: Insufficient documentation

## 2022-09-19 DIAGNOSIS — Y92410 Unspecified street and highway as the place of occurrence of the external cause: Secondary | ICD-10-CM | POA: Insufficient documentation

## 2022-09-19 DIAGNOSIS — Z87891 Personal history of nicotine dependence: Secondary | ICD-10-CM | POA: Diagnosis not present

## 2022-09-19 DIAGNOSIS — S0003XA Contusion of scalp, initial encounter: Secondary | ICD-10-CM | POA: Diagnosis not present

## 2022-09-19 DIAGNOSIS — J45909 Unspecified asthma, uncomplicated: Secondary | ICD-10-CM | POA: Insufficient documentation

## 2022-09-19 DIAGNOSIS — D164 Benign neoplasm of bones of skull and face: Secondary | ICD-10-CM | POA: Insufficient documentation

## 2022-09-19 DIAGNOSIS — Z79899 Other long term (current) drug therapy: Secondary | ICD-10-CM | POA: Diagnosis not present

## 2022-09-19 MED ORDER — ONDANSETRON 4 MG PO TBDP
8.0000 mg | ORAL_TABLET | Freq: Once | ORAL | Status: AC
  Administered 2022-09-19: 8 mg via ORAL

## 2022-09-19 MED ORDER — HYDROCODONE-ACETAMINOPHEN 5-325 MG PO TABS
1.0000 | ORAL_TABLET | Freq: Once | ORAL | Status: DC
  Filled 2022-09-19: qty 1

## 2022-09-19 MED ORDER — ONDANSETRON 4 MG PO TBDP
ORAL_TABLET | ORAL | Status: AC
  Filled 2022-09-19: qty 1

## 2022-09-19 MED ORDER — HYDROCODONE-ACETAMINOPHEN 5-325 MG PO TABS: 1.0000 | ORAL_TABLET | Freq: Four times a day (QID) | ORAL | 0 refills | Status: AC | PRN

## 2022-09-19 MED ORDER — OXYCODONE HCL 5 MG PO TABS
5.0000 mg | ORAL_TABLET | Freq: Once | ORAL | Status: AC
  Administered 2022-09-19: 5 mg via ORAL
  Filled 2022-09-19: qty 1

## 2022-09-19 NOTE — ED Notes (Signed)
ED Provider at bedside. 
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
## 2022-09-24 ENCOUNTER — Ambulatory Visit: Payer: Medicaid Other | Admitting: Family Medicine

## 2022-09-29 ENCOUNTER — Ambulatory Visit: Payer: Medicaid Other | Admitting: Family Medicine

## 2022-10-13 ENCOUNTER — Encounter: Payer: Self-pay | Admitting: *Deleted

## 2023-02-01 ENCOUNTER — Encounter (HOSPITAL_BASED_OUTPATIENT_CLINIC_OR_DEPARTMENT_OTHER): Payer: Self-pay | Admitting: Emergency Medicine

## 2023-02-01 ENCOUNTER — Other Ambulatory Visit: Payer: Self-pay

## 2023-02-01 ENCOUNTER — Emergency Department (HOSPITAL_BASED_OUTPATIENT_CLINIC_OR_DEPARTMENT_OTHER)
Admission: EM | Admit: 2023-02-01 | Discharge: 2023-02-01 | Disposition: A | Discharge status: Home / Self Care | Payer: Medicaid Other | Source: Home / Self Care | Attending: Emergency Medicine | Admitting: Emergency Medicine

## 2023-02-01 DIAGNOSIS — J45909 Unspecified asthma, uncomplicated: Secondary | ICD-10-CM | POA: Diagnosis not present

## 2023-02-01 DIAGNOSIS — J029 Acute pharyngitis, unspecified: Secondary | ICD-10-CM | POA: Diagnosis not present

## 2023-02-01 DIAGNOSIS — R519 Headache, unspecified: Secondary | ICD-10-CM | POA: Diagnosis not present

## 2023-02-01 DIAGNOSIS — L03211 Cellulitis of face: Secondary | ICD-10-CM | POA: Diagnosis not present

## 2023-02-01 DIAGNOSIS — R21 Rash and other nonspecific skin eruption: Secondary | ICD-10-CM | POA: Diagnosis present

## 2023-02-01 MED ORDER — DOXYCYCLINE HYCLATE 100 MG PO TABS
100.0000 mg | ORAL_TABLET | Freq: Once | ORAL | Status: AC
  Administered 2023-02-01: 100 mg via ORAL
  Filled 2023-02-01: qty 1

## 2023-02-01 MED ORDER — DOXYCYCLINE HYCLATE 100 MG PO CAPS: 100.0000 mg | ORAL_CAPSULE | Freq: Two times a day (BID) | ORAL | 0 refills | Status: AC

## 2023-02-01 NOTE — ED Provider Notes (Signed)
   Emergency Department Provider Note   I have reviewed the triage vital signs and the nursing notes.   HISTORY  Chief Complaint Abscess   HPI Tiffany Anthony is a 27 y.o. female with PMH of asthma presents to the ED with a developing tender rash to the chin for the last 6 days. Reports Tmax of 99.80F at home. Some mild sore throat and swelling with drainage. Mild HA. No vomiting. No dental pain.    Past Medical History:  Diagnosis Date   Asthma     Review of Systems  Constitutional: Subjective fever. No chills.  ENT: Mild sore throat. Cardiovascular: Denies chest pain. Respiratory: Denies shortness of breath. Gastrointestinal: No abdominal pain. Genitourinary: Negative for dysuria. Musculoskeletal: Negative for back pain. Skin: Chin rash/swelling.  Neurological: Mild HA.    ____________________________________________   PHYSICAL EXAM:  VITAL SIGNS: ED Triage Vitals  Encounter Vitals Group     BP 02/01/23 0430 121/88     Pulse Rate 02/01/23 0430 97     Resp 02/01/23 0430 16     Temp 02/01/23 0430 98 F (36.7 C)     Temp src --      SpO2 02/01/23 0430 98 %   Constitutional: Alert and oriented. Well appearing and in no acute distress. Eyes: Conjunctivae are normal.  Head: Atraumatic. Nose: No congestion/rhinnorhea. Mouth/Throat: Mucous membranes are moist.  Oropharynx with mild erythema. Minimal tonsillar hypertrophy. No PTA. Clear voice. No trismus.  Neck: No stridor. Scant cervical adenopathy.  Cardiovascular: Good peripheral circulation.  Respiratory: Normal respiratory effort.   Gastrointestinal: No distention.  Musculoskeletal: No gross deformities of extremities. Neurologic:  Normal speech and language.  Skin:  Skin is warm and dry. Approx 1 cm area of induration to the shin. No fluctuance. No tracking induration to the neck or face.   ___________________________________________   PROCEDURES  Procedure(s) performed:   Procedures  None   ____________________________________________   INITIAL IMPRESSION / ASSESSMENT AND PLAN / ED COURSE  Pertinent labs & imaging results that were available during my care of the patient were reviewed by me and considered in my medical decision making (see chart for details).   This patient is Presenting for Evaluation of face pain, which does require a range of treatment options, and is a complaint that involves a moderate risk of morbidity and mortality.  The Differential Diagnoses include cellulitis, abscess, contact dermatitis, etc.  Critical Interventions-    Medications  doxycycline (VIBRA-TABS) tablet 100 mg (has no administration in time range)    Medical Decision Making: Summary:  Patient with chin pain with rash and subjective fever. Tonsils mildly swollen. No exudate. Exam not consistent with PTA. No signs to suggest deeper space neck infection requiring imaging. Plan for Doxycycline and close PCP follow up.   Patient's presentation is most consistent with acute, uncomplicated illness.   Disposition: discharge  ____________________________________________  FINAL CLINICAL IMPRESSION(S) / ED DIAGNOSES  Final diagnoses:  Cellulitis of face     NEW OUTPATIENT MEDICATIONS STARTED DURING THIS VISIT:  New Prescriptions   DOXYCYCLINE (VIBRAMYCIN) 100 MG CAPSULE    Take 1 capsule (100 mg total) by mouth 2 (two) times daily for 7 days.    Note:  This document was prepared using Dragon voice recognition software and may include unintentional dictation errors.  Alona Bene, MD, [facility]    Jalise Zawistowski, Arlyss Repress, MD 02/01/23 [phone]

## 2023-02-01 NOTE — ED Triage Notes (Signed)
Patient with abscess on chin, she states that it has been going on for 6 days, she has had some drainage from the abscess, won't come to surface, she has a headache and low grade fevers due to it.

## 2023-02-01 NOTE — Discharge Instructions (Signed)
Please take your antibiotics and follow with your primary care doctor. Return to the ED with any new or worsening symptoms.

## 2023-02-21 IMAGING — CT CT RENAL STONE PROTOCOL
2 of 4 series · 16 of 46 positions shown, 18 images · non-contrast
Comparison: None.

CLINICAL DATA: Flank pain.  Evaluate for kidney stone.



[Series 3: axial st · axial · 0.98mm/px · z∈[+664,+1109]mm · 13 of 97 slices shown, 15 images]
[im 4/97  soft-tissue]
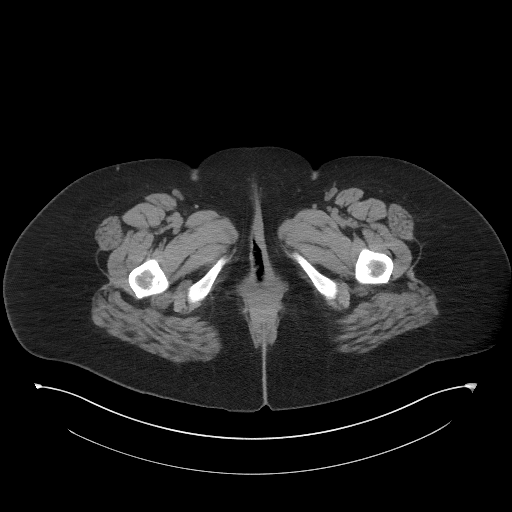
[im 4/97  bone]
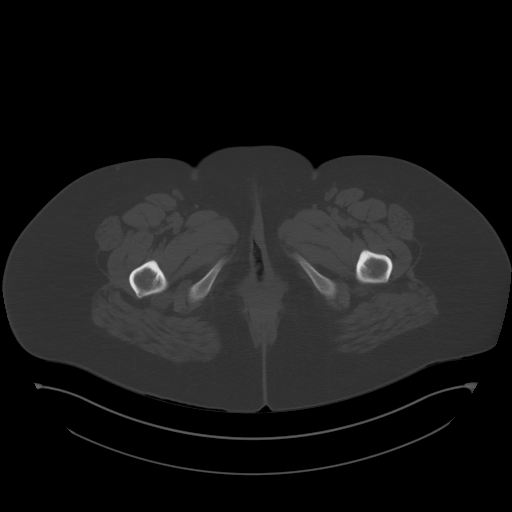
[im 12/97  soft-tissue]
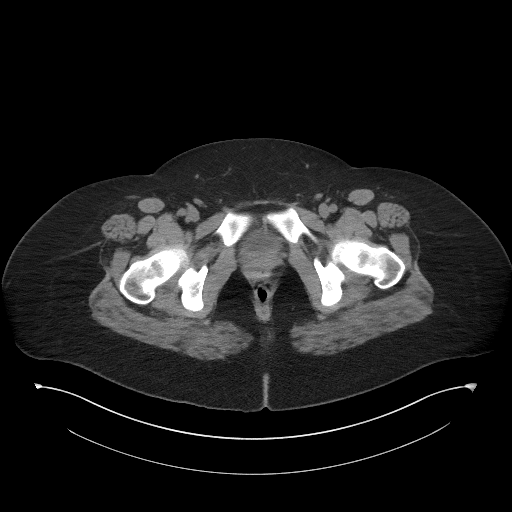
[im 20/97  soft-tissue]
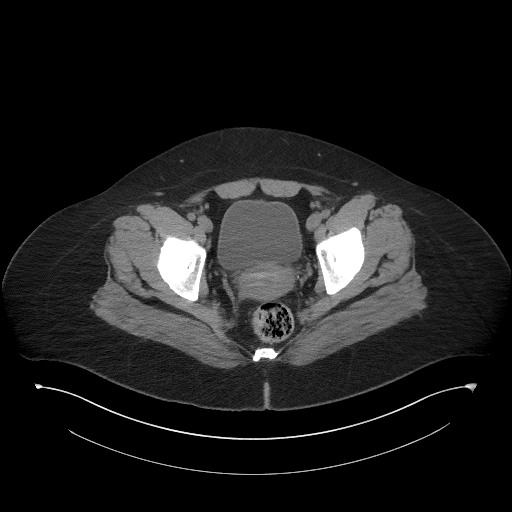
[im 27/97  soft-tissue]
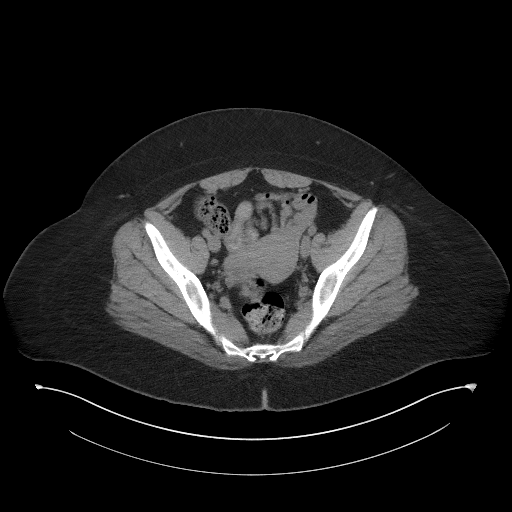
[im 35/97  soft-tissue]
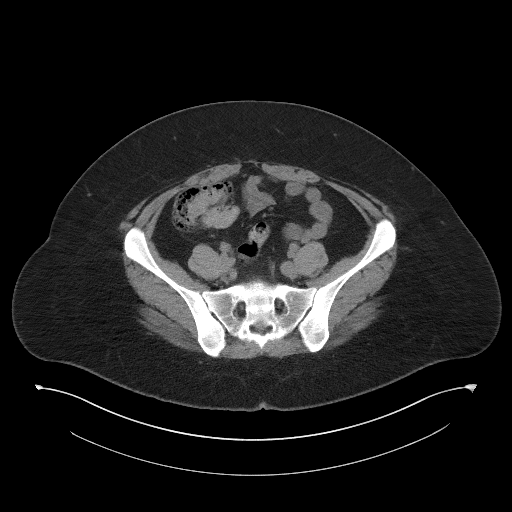
[im 43/97  soft-tissue]
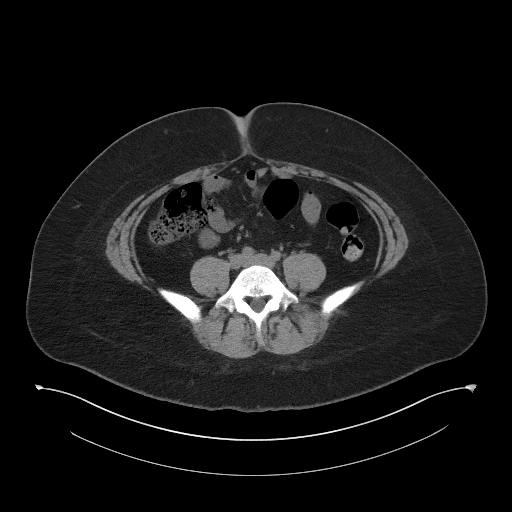
[im 50/97  soft-tissue]
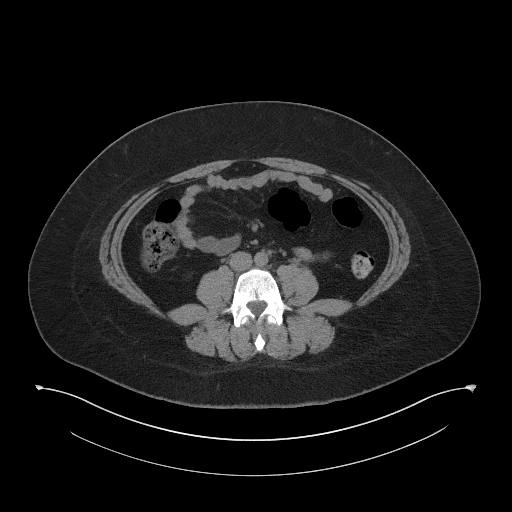
[im 54/97  soft-tissue]
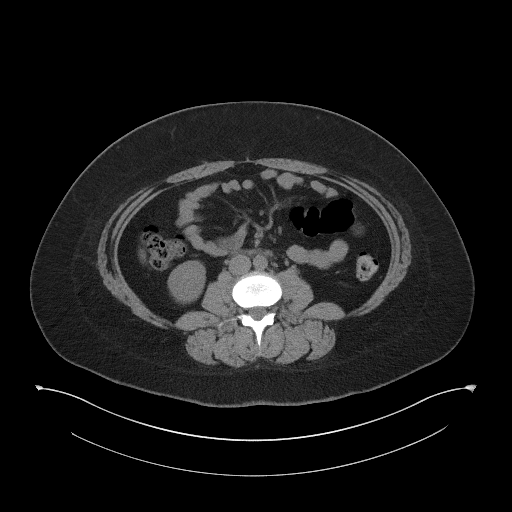
[im 62/97  soft-tissue]
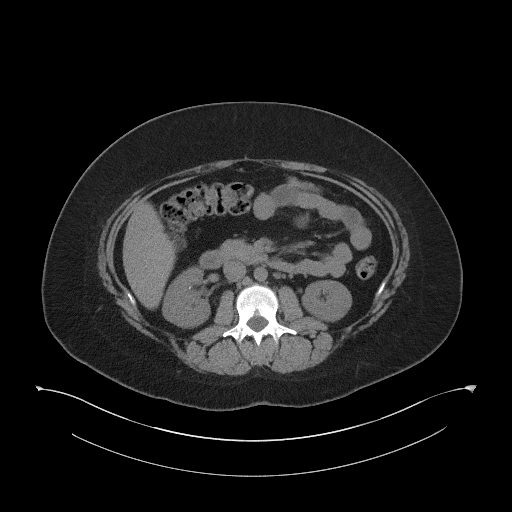
[im 62/97  bone]
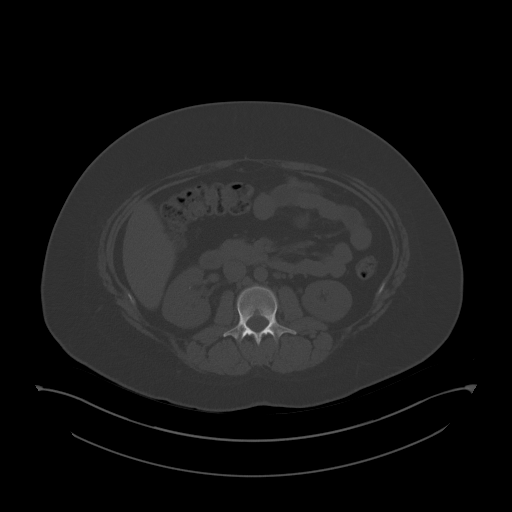
[im 70/97  soft-tissue]
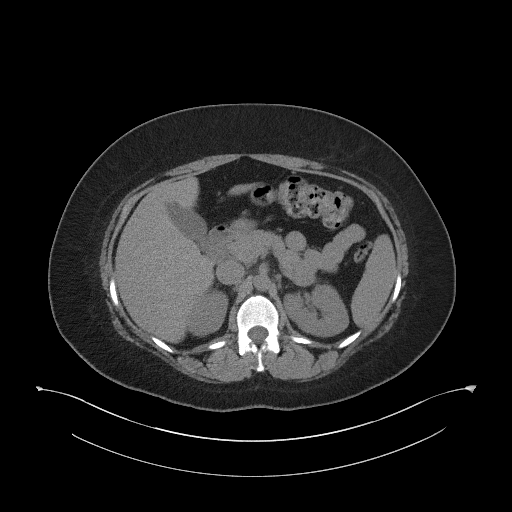
[im 77/97  soft-tissue]
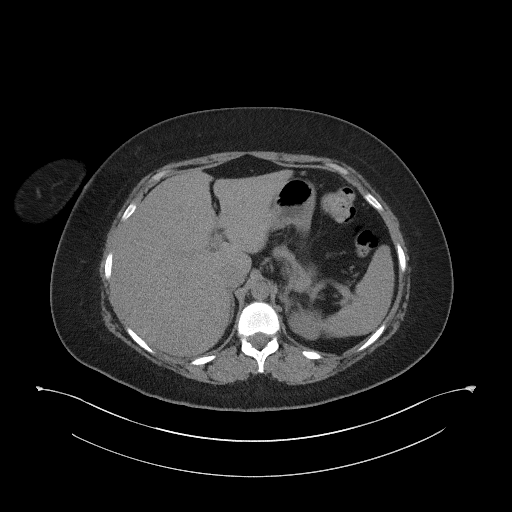
[im 85/97  soft-tissue]
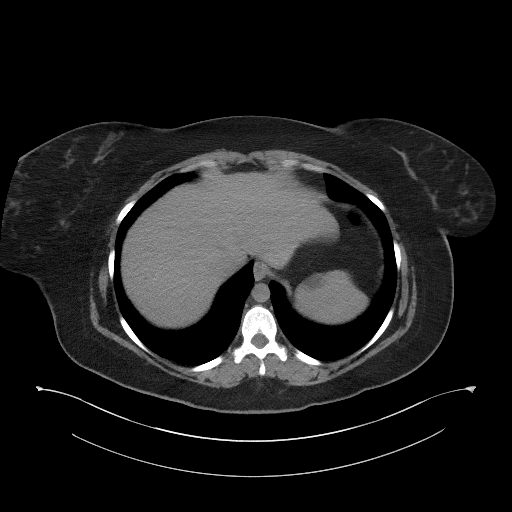
[im 93/97  soft-tissue]
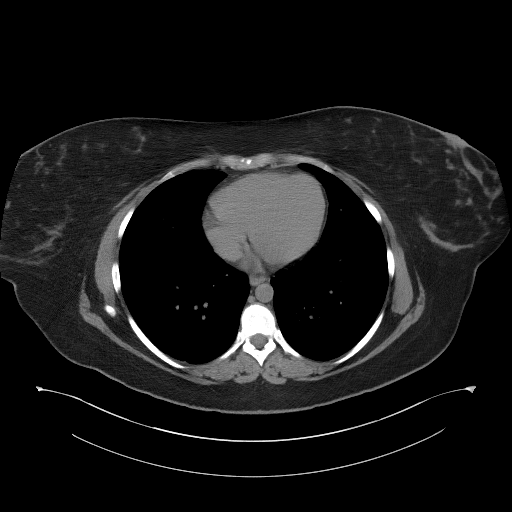

[Series 7: coronal st · coronal · 0.82mm/px · 3 of 89 slices shown]
[im 30/89  soft-tissue]
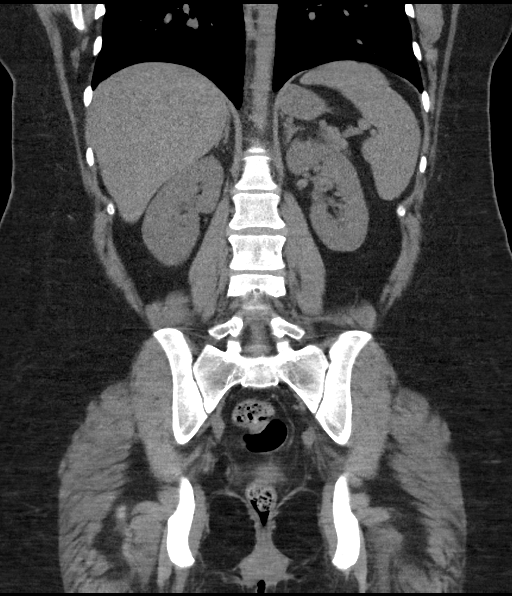
[im 40/89  soft-tissue]
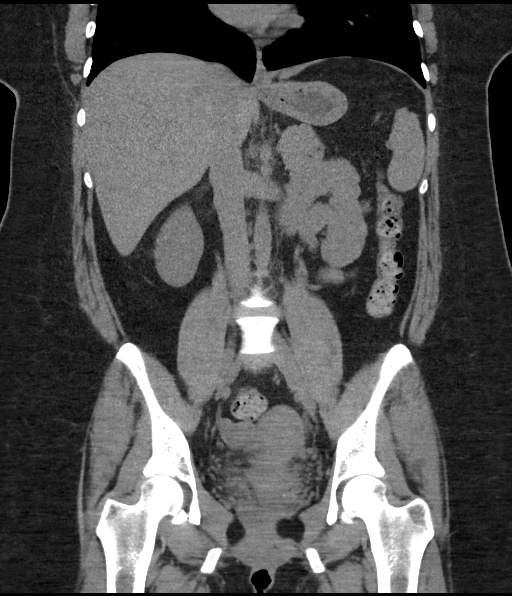
[im 49/89  soft-tissue]
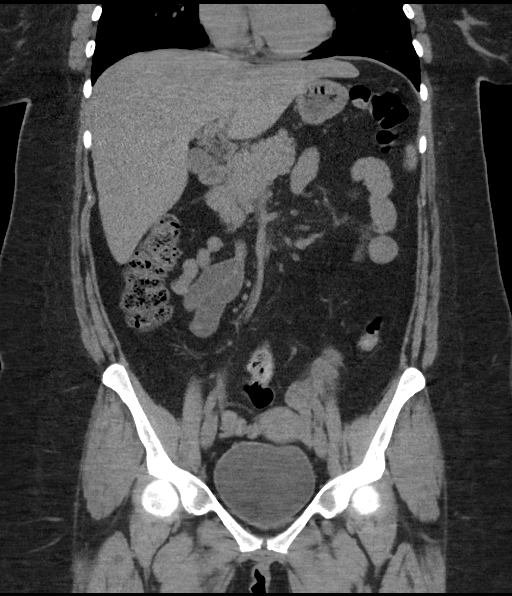

[16 of 46 positions shown; findings below may reference images not displayed]

FINDINGS: Lower chest: Lung bases are clear.

Hepatobiliary: No focal liver abnormality. Gallbladder appears
normal. No bile duct dilatation.

Pancreas: Unremarkable. No pancreatic ductal dilatation or
surrounding inflammatory changes.

Spleen: Normal in size without focal abnormality.

Adrenals/Urinary Tract: Normal adrenal glands. Bilateral renal
calculi are identified. The largest right renal calculi is in the
upper pole measuring 2-3 mm, image [DATE]. On the left the largest
stone is in the upper pole measuring 3 mm. There is no
hydronephrosis or mass bilaterally. No convincing evidence for
hydroureter or ureteral lithiasis bilaterally. No bladder calculi
noted. Urinary bladder appears unremarkable.

Stomach/Bowel: Stomach is within normal limits. The appendix is
visualized and is within normal limits, image 52/7. No bowel wall
thickening, inflammation, or distension.

Vascular/Lymphatic: No significant vascular findings are present. No
enlarged abdominal or pelvic lymph nodes.

Reproductive: Uterus appears normal. No adnexal mass. Fluid density
cyst within the right ovary measures 2.3 cm.

Other: No abdominal wall hernia or abnormality. No abdominopelvic
ascites.

Musculoskeletal: No acute or significant osseous findings.
IMPRESSION: 1. No acute findings within the abdomen or pelvis.
2. Bilateral nephrolithiasis.
3. 2.3 cm right ovarian simple-appearing cyst. No follow-up imaging
is recommended.
